# Patient Record
Sex: Female | Born: 1965 | Race: White | Hispanic: No | Marital: Married | Smoking: Never smoker
Health system: Southern US, Community
[De-identification: ages and names within clinical notes are randomized; demographics above are authoritative.]

## PROBLEM LIST (undated history)

## (undated) DIAGNOSIS — K219 Gastro-esophageal reflux disease without esophagitis: Secondary | ICD-10-CM

## (undated) DIAGNOSIS — R000 Tachycardia, unspecified: Secondary | ICD-10-CM

## (undated) DIAGNOSIS — H409 Unspecified glaucoma: Secondary | ICD-10-CM

## (undated) HISTORY — PX: VARICOSE VEIN SURGERY: SHX832

---

## 1999-06-16 ENCOUNTER — Other Ambulatory Visit: Admission: RE | Admit: 1999-06-16 | Discharge: 1999-06-16 | Payer: Self-pay | Admitting: Gynecology

## 1999-07-15 ENCOUNTER — Encounter: Admission: RE | Admit: 1999-07-15 | Discharge: 1999-10-13 | Payer: Self-pay | Admitting: Gynecology

## 1999-12-01 ENCOUNTER — Inpatient Hospital Stay (HOSPITAL_COMMUNITY): Admission: AD | Admit: 1999-12-01 | Discharge: 1999-12-01 | Payer: Self-pay | Admitting: Gynecology

## 1999-12-01 ENCOUNTER — Encounter: Payer: Self-pay | Admitting: Gynecology

## 1999-12-08 ENCOUNTER — Encounter: Payer: Self-pay | Admitting: Gynecology

## 1999-12-08 ENCOUNTER — Ambulatory Visit (HOSPITAL_COMMUNITY): Admission: RE | Admit: 1999-12-08 | Discharge: 1999-12-08 | Payer: Self-pay | Admitting: Gynecology

## 1999-12-10 ENCOUNTER — Inpatient Hospital Stay (HOSPITAL_COMMUNITY): Admission: AD | Admit: 1999-12-10 | Discharge: 1999-12-13 | Payer: Self-pay | Admitting: Gynecology

## 2002-05-08 ENCOUNTER — Encounter: Payer: Self-pay | Admitting: Internal Medicine

## 2002-05-08 ENCOUNTER — Ambulatory Visit (HOSPITAL_COMMUNITY): Admission: RE | Admit: 2002-05-08 | Discharge: 2002-05-08 | Payer: Self-pay | Admitting: Internal Medicine

## 2003-02-04 ENCOUNTER — Other Ambulatory Visit: Admission: RE | Admit: 2003-02-04 | Discharge: 2003-02-04 | Payer: Self-pay | Admitting: Gynecology

## 2003-02-04 ENCOUNTER — Ambulatory Visit (HOSPITAL_COMMUNITY): Admission: RE | Admit: 2003-02-04 | Discharge: 2003-02-04 | Payer: Self-pay | Admitting: Gynecology

## 2004-02-24 ENCOUNTER — Other Ambulatory Visit: Admission: RE | Admit: 2004-02-24 | Discharge: 2004-02-24 | Payer: Self-pay | Admitting: Gynecology

## 2004-03-06 ENCOUNTER — Encounter: Admission: RE | Admit: 2004-03-06 | Discharge: 2004-03-06 | Payer: Self-pay | Admitting: Gynecology

## 2005-03-25 ENCOUNTER — Other Ambulatory Visit: Admission: RE | Admit: 2005-03-25 | Discharge: 2005-03-25 | Payer: Self-pay | Admitting: Gynecology

## 2009-06-10 ENCOUNTER — Encounter: Payer: Self-pay | Admitting: Gynecology

## 2009-06-10 ENCOUNTER — Other Ambulatory Visit: Admission: RE | Admit: 2009-06-10 | Discharge: 2009-06-10 | Payer: Self-pay | Admitting: Gynecology

## 2009-06-10 ENCOUNTER — Ambulatory Visit: Payer: Self-pay | Admitting: Gynecology

## 2009-07-01 ENCOUNTER — Encounter: Admission: RE | Admit: 2009-07-01 | Discharge: 2009-07-01 | Payer: Self-pay | Admitting: Gynecology

## 2015-07-31 ENCOUNTER — Ambulatory Visit (HOSPITAL_COMMUNITY)
Admission: RE | Admit: 2015-07-31 | Discharge: 2015-07-31 | Disposition: A | Discharge status: Home / Self Care | Payer: BC Managed Care – PPO | Source: Ambulatory Visit | Attending: Physician Assistant | Admitting: Physician Assistant

## 2015-07-31 ENCOUNTER — Other Ambulatory Visit (HOSPITAL_COMMUNITY): Payer: Self-pay | Admitting: Physician Assistant

## 2015-07-31 DIAGNOSIS — R079 Chest pain, unspecified: Secondary | ICD-10-CM

## 2015-08-13 ENCOUNTER — Other Ambulatory Visit (HOSPITAL_COMMUNITY): Payer: Self-pay | Admitting: Physician Assistant

## 2015-08-13 DIAGNOSIS — Z1231 Encounter for screening mammogram for malignant neoplasm of breast: Secondary | ICD-10-CM

## 2015-08-20 ENCOUNTER — Ambulatory Visit (HOSPITAL_COMMUNITY): Payer: BC Managed Care – PPO

## 2015-09-01 ENCOUNTER — Ambulatory Visit (HOSPITAL_COMMUNITY)
Admission: RE | Admit: 2015-09-01 | Discharge: 2015-09-01 | Disposition: A | Discharge status: Home / Self Care | Payer: BC Managed Care – PPO | Source: Ambulatory Visit | Attending: Physician Assistant | Admitting: Physician Assistant

## 2015-09-01 DIAGNOSIS — Z1231 Encounter for screening mammogram for malignant neoplasm of breast: Secondary | ICD-10-CM | POA: Diagnosis not present

## 2016-08-24 ENCOUNTER — Emergency Department (HOSPITAL_COMMUNITY)
Admission: EM | Admit: 2016-08-24 | Discharge: 2016-08-24 | Disposition: A | Discharge status: Home / Self Care | Payer: No Typology Code available for payment source | Attending: Emergency Medicine | Admitting: Emergency Medicine

## 2016-08-24 ENCOUNTER — Emergency Department (HOSPITAL_COMMUNITY): Payer: No Typology Code available for payment source

## 2016-08-24 ENCOUNTER — Encounter (HOSPITAL_COMMUNITY): Payer: Self-pay | Admitting: Emergency Medicine

## 2016-08-24 DIAGNOSIS — Y999 Unspecified external cause status: Secondary | ICD-10-CM | POA: Insufficient documentation

## 2016-08-24 DIAGNOSIS — S0003XA Contusion of scalp, initial encounter: Secondary | ICD-10-CM

## 2016-08-24 DIAGNOSIS — Y9389 Activity, other specified: Secondary | ICD-10-CM | POA: Insufficient documentation

## 2016-08-24 DIAGNOSIS — S20219S Contusion of unspecified front wall of thorax, sequela: Secondary | ICD-10-CM

## 2016-08-24 DIAGNOSIS — Y92410 Unspecified street and highway as the place of occurrence of the external cause: Secondary | ICD-10-CM | POA: Insufficient documentation

## 2016-08-24 HISTORY — DX: Gastro-esophageal reflux disease without esophagitis: K21.9

## 2016-08-24 HISTORY — DX: Unspecified glaucoma: H40.9

## 2016-08-24 HISTORY — DX: Tachycardia, unspecified: R00.0

## 2016-08-24 LAB — TROPONIN I: Troponin I: <0.03 ng/mL (ref <0.03)

## 2016-08-24 MED ORDER — ONDANSETRON HCL 4 MG PO TABS
4.0000 mg | ORAL_TABLET | Freq: Once | ORAL | Status: AC
  Administered 2016-08-24: 4 mg via ORAL
  Filled 2016-08-24: qty 1

## 2016-08-24 MED ORDER — HYDROCODONE-ACETAMINOPHEN 5-325 MG PO TABS: 1.0000 | ORAL_TABLET | ORAL | 0 refills | Status: DC | PRN

## 2016-08-24 MED ORDER — HYDROCODONE-ACETAMINOPHEN 5-325 MG PO TABS
2.0000 | ORAL_TABLET | Freq: Once | ORAL | Status: AC
  Administered 2016-08-24: 2 via ORAL
  Filled 2016-08-24: qty 2

## 2016-08-24 MED ORDER — METHOCARBAMOL 500 MG PO TABS: 500.0000 mg | ORAL_TABLET | Freq: Three times a day (TID) | ORAL | 0 refills | Status: DC

## 2016-08-24 NOTE — ED Notes (Signed)
Troooper in with pt
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
## 2016-08-24 NOTE — ED Notes (Signed)
Pt returned from ct

## 2016-08-24 NOTE — ED Notes (Signed)
Pt taken to ct 
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
## 2016-08-24 NOTE — Discharge Instructions (Signed)
Your vital signs within normal limits. The CT scan of your head is negative for acute injury. The CT scan of your neck shows degenerative disc related problems and arthritis, but no fracture or dislocation. Your chest x-ray is negative for any lung or bone structure injury. Please use ibuprofen for mild soreness. Please use Robaxin for spasm pain, use Norco for severe pain. Robaxin and Norco may cause drowsiness or lightheadedness. Please do not drink alcohol, drive a vehicle, operate machinery, or participate in activities requiring concentration when taking this medication. Please return to the emergency department if any changes, problems, or concerns.

## 2017-08-12 ENCOUNTER — Other Ambulatory Visit (HOSPITAL_COMMUNITY): Payer: Self-pay | Admitting: Family Medicine

## 2017-08-12 DIAGNOSIS — Z1231 Encounter for screening mammogram for malignant neoplasm of breast: Secondary | ICD-10-CM

## 2017-08-29 ENCOUNTER — Ambulatory Visit (HOSPITAL_COMMUNITY)
Admission: RE | Admit: 2017-08-29 | Discharge: 2017-08-29 | Disposition: A | Discharge status: Home / Self Care | Payer: BC Managed Care – PPO | Source: Ambulatory Visit | Attending: Family Medicine | Admitting: Family Medicine

## 2017-08-29 DIAGNOSIS — Z1231 Encounter for screening mammogram for malignant neoplasm of breast: Secondary | ICD-10-CM | POA: Diagnosis not present

## 2018-10-25 ENCOUNTER — Encounter: Payer: Self-pay | Admitting: Cardiovascular Disease

## 2018-10-25 ENCOUNTER — Ambulatory Visit: Payer: BC Managed Care – PPO | Admitting: Cardiovascular Disease

## 2018-10-25 VITALS — BP 110/70 | HR 79 | Ht 63.0 in | Wt 265.0 lb

## 2018-10-25 DIAGNOSIS — Z7182 Exercise counseling: Secondary | ICD-10-CM | POA: Diagnosis not present

## 2018-10-25 DIAGNOSIS — I499 Cardiac arrhythmia, unspecified: Secondary | ICD-10-CM

## 2018-10-25 DIAGNOSIS — R002 Palpitations: Secondary | ICD-10-CM | POA: Diagnosis not present

## 2018-10-25 NOTE — Patient Instructions (Signed)
Medication Instructions: Your physician recommends that you continue on your current medications as directed. Please refer to the Current Medication list given to you today.   If you need a refill on your cardiac medications before your next appointment, please call your pharmacy.   Lab work:NONE  If you have labs (blood work) drawn today and your tests are completely normal, you will receive your results only by: Marland Kitchen MyChart Message (if you have MyChart) OR . A paper copy in the mail If you have any lab test that is abnormal or we need to change your treatment, we will call you to review the results.  Testing/Procedures:NONE  Follow-Up:As needed with Dr.Koneswaran  Any Other Special Instructions Will Be Listed Below (If Applicable). NONE

## 2018-10-25 NOTE — Progress Notes (Signed)
CARDIOLOGY CONSULT NOTE  Patient ID: AHMONI EDGE MRN: [id number] DOB/AGE: [birth year] 52 y.o.  Admit date: (Not on file) Primary Physician: Samuella Bruin Referring Physician: Samuella Bruin   Reason for Consultation: Arrhythmia  HPI: NEETA STOREY is a 52 y.o. female who is being seen today for the evaluation of arrhythmia at the request of Shawnie Dapper, PA-C.   She is currently being treated with Toprol-XL 100 mg daily.  I have no copies of ECGs from PCP.  ECG performed in the office today which I ordered and personally interpreted demonstrates normal sinus rhythm with incomplete right bundle branch block and late R wave transition.  She tells me that she has had palpitations dating back to 2003.  At that time she wore a monitor and had an echocardiogram.  These results are unavailable to me.  She very seldom has palpitations and they may occur once every few months and sometimes once a month.  They may last a few seconds and up to a minute.  They are not associated with chest pain nor shortness of breath.  She denies a history of orthopnea paroxysmal nocturnal dyspnea.  She denies a history of snoring and sleep apnea.  She previously underwent venous ablation in both legs.  She has had chronic right ankle swelling since that time.  She has GERD and takes Tums and omeprazole as needed.  She denies lightheadedness, dizziness, and syncope.  She has been on Toprol-XL since 2003.  She is obese.  She previously lost 58 pounds and put all of it back on.  She has several questions regarding weight loss.   Not on File  Current Outpatient Medications  Medication Sig Dispense Refill  . ALPHAGAN P 0.1 % SOLN Apply 1 drop to eye daily.    . Ascorbic Acid (VITAMIN C PO) Take 1 tablet by mouth daily.    . dorzolamide-timolol (COSOPT) 22.3-6.8 MG/ML ophthalmic solution 1 drop 2 (two) times daily.    Marland Kitchen glucosamine-chondroitin 500-400 MG tablet Take 1  tablet by mouth daily.    Marland Kitchen ibuprofen (ADVIL,MOTRIN) 200 MG tablet Take 400-600 mg by mouth every 6 (six) hours as needed for moderate pain.    Marland Kitchen latanoprost (XALATAN) 0.005 % ophthalmic solution Place 1 drop into both eyes at bedtime.    . metoprolol succinate (TOPROL-XL) 100 MG 24 hr tablet Take 1 tablet by mouth daily.    Marland Kitchen omeprazole (PRILOSEC) 40 MG capsule Take 1 capsule by mouth daily.    Marland Kitchen VITAMIN E PO Take 1 tablet by mouth daily.    Marland Kitchen HYDROcodone-acetaminophen (NORCO/VICODIN) 5-325 MG tablet Take 1 tablet by mouth every 4 (four) hours as needed. (Patient not taking: Reported on 10/25/2018) 15 tablet 0   No current facility-administered medications for this visit.     Past Medical History:  Diagnosis Date  . GERD (gastroesophageal reflux disease)   . Glaucoma   . Tachycardia     Past Surgical History:  Procedure Laterality Date  . VARICOSE VEIN SURGERY      Social History   Socioeconomic History  . Marital status: Married    Spouse name: Not on file  . Number of children: Not on file  . Years of education: Not on file  . Highest education level: Not on file  Occupational History  . Not on file  Social Needs  . Financial resource strain: Not on file  . Food insecurity:    Worry:  Not on file    Inability: Not on file  . Transportation needs:    Medical: Not on file    Non-medical: Not on file  Tobacco Use  . Smoking status: Never Smoker  . Smokeless tobacco: Never Used  Substance and Sexual Activity  . Alcohol use: No  . Drug use: No  . Sexual activity: Not on file  Lifestyle  . Physical activity:    Days per week: Not on file    Minutes per session: Not on file  . Stress: Not on file  Relationships  . Social connections:    Talks on phone: Not on file    Gets together: Not on file    Attends religious service: Not on file    Active member of club or organization: Not on file    Attends meetings of clubs or organizations: Not on file    Relationship  status: Not on file  . Intimate partner violence:    Fear of current or ex partner: Not on file    Emotionally abused: Not on file    Physically abused: Not on file    Forced sexual activity: Not on file  Other Topics Concern  . Not on file  Social History Narrative  . Not on file     No family history of premature CAD in 1st degree relatives.  Current Meds  Medication Sig  . ALPHAGAN P 0.1 % SOLN Apply 1 drop to eye daily.  . Ascorbic Acid (VITAMIN C PO) Take 1 tablet by mouth daily.  . dorzolamide-timolol (COSOPT) 22.3-6.8 MG/ML ophthalmic solution 1 drop 2 (two) times daily.  Marland Kitchen glucosamine-chondroitin 500-400 MG tablet Take 1 tablet by mouth daily.  Marland Kitchen ibuprofen (ADVIL,MOTRIN) 200 MG tablet Take 400-600 mg by mouth every 6 (six) hours as needed for moderate pain.  Marland Kitchen latanoprost (XALATAN) 0.005 % ophthalmic solution Place 1 drop into both eyes at bedtime.  . metoprolol succinate (TOPROL-XL) 100 MG 24 hr tablet Take 1 tablet by mouth daily.  Marland Kitchen omeprazole (PRILOSEC) 40 MG capsule Take 1 capsule by mouth daily.  Marland Kitchen VITAMIN E PO Take 1 tablet by mouth daily.      Review of systems complete and found to be negative unless listed above in HPI    Physical exam Blood pressure 110/70, pulse 79, height 5\' 3"  (1.6 m), weight 265 lb (120.2 kg), SpO2 98 %. General: NAD Neck: No JVD, no thyromegaly or thyroid nodule.  Lungs: Clear to auscultation bilaterally with normal respiratory effort. CV: Nondisplaced PMI. Regular rate and rhythm, normal S1/S2, no S3/S4, no murmur.  No peripheral edema.  No carotid bruit.    Abdomen: Soft, nontender, no distention, obese.  Skin: Intact without lesions or rashes.  Neurologic: Alert and oriented x 3.  Psych: Normal affect. Extremities: No clubbing or cyanosis.  HEENT: Normal.   ECG: Most recent ECG reviewed.   Labs: No results found for: K, BUN, CREATININE, ALT, TSH, HGB   Lipids: No results found for: LDLCALC, LDLDIRECT, CHOL, TRIG, HDL       ASSESSMENT AND PLAN:  1.  Arrhythmia/palpitations: Currently on Toprol-XL 100 mg daily.  Symptomatically stable.  I informed her that if palpitation frequency increased or they became associated with chest pain and/or shortness of breath, I would then consider cardiac monitoring as well as an echocardiogram.  At the present time, noninvasive studies are not indicated.  2.  Morbid obesity: I provided exercise counseling.  I encouraged her to begin a routine walking  regimen.  I educated her about the adverse effects of obesity over time and its association with chronic illnesses.     Disposition: Follow up as needed  Signed: Prentice Docker, M.D., F.A.C.C.  10/25/2018, 9:21 AM

## 2019-05-28 ENCOUNTER — Other Ambulatory Visit (HOSPITAL_COMMUNITY): Payer: Self-pay | Admitting: Physician Assistant

## 2019-05-28 DIAGNOSIS — Z1231 Encounter for screening mammogram for malignant neoplasm of breast: Secondary | ICD-10-CM

## 2019-06-04 ENCOUNTER — Ambulatory Visit (HOSPITAL_COMMUNITY)
Admission: RE | Admit: 2019-06-04 | Discharge: 2019-06-04 | Disposition: A | Discharge status: Home / Self Care | Payer: BC Managed Care – PPO | Source: Ambulatory Visit | Attending: Physician Assistant | Admitting: Physician Assistant

## 2019-06-04 ENCOUNTER — Other Ambulatory Visit: Payer: Self-pay

## 2019-06-04 DIAGNOSIS — Z1231 Encounter for screening mammogram for malignant neoplasm of breast: Secondary | ICD-10-CM | POA: Diagnosis not present

## 2019-07-04 ENCOUNTER — Other Ambulatory Visit: Payer: BC Managed Care – PPO | Admitting: Adult Health

## 2019-07-12 ENCOUNTER — Telehealth: Payer: Self-pay | Admitting: Obstetrics and Gynecology

## 2019-07-12 NOTE — Telephone Encounter (Signed)

## 2019-07-13 ENCOUNTER — Other Ambulatory Visit (HOSPITAL_COMMUNITY)
Admission: RE | Admit: 2019-07-13 | Discharge: 2019-07-13 | Disposition: A | Discharge status: Home / Self Care | Payer: BC Managed Care – PPO | Source: Ambulatory Visit | Attending: Obstetrics and Gynecology | Admitting: Obstetrics and Gynecology

## 2019-07-13 ENCOUNTER — Ambulatory Visit (INDEPENDENT_AMBULATORY_CARE_PROVIDER_SITE_OTHER): Payer: BC Managed Care – PPO | Admitting: Obstetrics and Gynecology

## 2019-07-13 ENCOUNTER — Other Ambulatory Visit: Payer: Self-pay

## 2019-07-13 ENCOUNTER — Encounter: Payer: Self-pay | Admitting: Obstetrics and Gynecology

## 2019-07-13 VITALS — BP 129/75 | HR 72 | Ht 63.0 in | Wt 269.4 lb

## 2019-07-13 DIAGNOSIS — Z01419 Encounter for gynecological examination (general) (routine) without abnormal findings: Secondary | ICD-10-CM | POA: Insufficient documentation

## 2019-07-13 NOTE — Progress Notes (Signed)
Patient ID: Dorothy West, female   DOB: 1966/06/10, 53 y.o.   MRN: [id number]  Assessment:  Annual Gyn Exam Perimenopause Plan:  1. pap smear done, next pap due 3 years-5 yr 2. return annually or prn 3    Annual mammogram advised after age 53 Pt given cologuard information sheet Subjective:  Dorothy West is a 53 y.o. female No obstetric history on file. who presents for annual exam. No LMP recorded. Patient is perimenopausal. The patient has complaints today of believes she has some rectal hemorrhoids. Hasn't had period since March 11 2019.No problem bowel or urination  The following portions of the patient's history were reviewed and updated as appropriate: allergies, current medications, past family history, past medical history, past social history, past surgical history and problem list. Past Medical History:  Diagnosis Date  . GERD (gastroesophageal reflux disease)   . Glaucoma   . Tachycardia     Past Surgical History:  Procedure Laterality Date  . VARICOSE VEIN SURGERY       Current Outpatient Medications:  .  ALPHAGAN P 0.1 % SOLN, Apply 1 drop to eye daily., Disp: , Rfl:  .  Ascorbic Acid (VITAMIN C PO), Take 1 tablet by mouth daily., Disp: , Rfl:  .  dorzolamide-timolol (COSOPT) 22.3-6.8 MG/ML ophthalmic solution, 1 drop 2 (two) times daily., Disp: , Rfl:  .  glucosamine-chondroitin 500-400 MG tablet, Take 1 tablet by mouth daily., Disp: , Rfl:  .  HYDROcodone-acetaminophen (NORCO/VICODIN) 5-325 MG tablet, Take 1 tablet by mouth every 4 (four) hours as needed. (Patient not taking: Reported on 10/25/2018), Disp: 15 tablet, Rfl: 0 .  ibuprofen (ADVIL,MOTRIN) 200 MG tablet, Take 400-600 mg by mouth every 6 (six) hours as needed for moderate pain., Disp: , Rfl:  .  latanoprost (XALATAN) 0.005 % ophthalmic solution, Place 1 drop into both eyes at bedtime., Disp: , Rfl:  .  metoprolol succinate (TOPROL-XL) 100 MG 24 hr tablet, Take 1 tablet by mouth daily., Disp: , Rfl:  .   omeprazole (PRILOSEC) 40 MG capsule, Take 1 capsule by mouth daily., Disp: , Rfl:  .  VITAMIN E PO, Take 1 tablet by mouth daily., Disp: , Rfl:   Review of Systems Constitutional: negative Gastrointestinal: negative Genitourinary: normal  Objective:  There were no vitals taken for this visit.   BMI: There is no height or weight on file to calculate BMI.  General Appearance: Alert, appropriate appearance for age. No acute distress HEENT: Grossly normal Neck / Thyroid:  Cardiovascular: RRR; normal S1, S2, no murmur Lungs: CTA bilaterally Back: No CVAT Breast Exam: No masses or nodes.No dimpling, nipple retraction or discharge.. Mammo done 06/05/2019 normal Gastrointestinal: Soft, non-tender, no masses or organomegaly Pelvic Exam: VAGINA: normal appearing vagina  CERVIX: normal appearing cervix  UTERUS: uterus is normal size, shape, consistency and nontender,  RECTAL: guaiac negative stool obtained, loose skin tags in rectal area, denies any sisues with bowel movements. PAP: Pap smear done today. Lymphatic Exam: Non-palpable nodes in neck, clavicular, axillary, or inguinal regions  Skin: no rash or abnormalities Neurologic: Normal gait and speech, no tremor  Psychiatric: Alert and oriented, appropriate affect.  Urinalysis:Not done  By signing my name below, I, Arnette NorrisMari Johnson, attest that this documentation has been prepared under the direction and in the presence of Tilda BurrowFerguson, Twylla Arceneaux V, MD. Electronically Signed: Arnette NorrisMari Johnson Medical Scribe. 07/13/19. 12:19 PM.  I personally performed the services described in this documentation, which was SCRIBED in my presence. The recorded information has  been reviewed and considered accurate. It has been edited as necessary during review. Jonnie Kind, MD

## 2019-07-17 LAB — CYTOLOGY - PAP
Diagnosis: NEGATIVE
HPV: NOT DETECTED

## 2019-11-07 ENCOUNTER — Telehealth: Payer: Self-pay | Admitting: *Deleted

## 2019-11-07 ENCOUNTER — Other Ambulatory Visit: Payer: Self-pay

## 2019-11-07 ENCOUNTER — Ambulatory Visit: Payer: BC Managed Care – PPO | Admitting: Podiatry

## 2019-11-07 ENCOUNTER — Ambulatory Visit (INDEPENDENT_AMBULATORY_CARE_PROVIDER_SITE_OTHER): Payer: BC Managed Care – PPO

## 2019-11-07 ENCOUNTER — Other Ambulatory Visit: Payer: Self-pay | Admitting: Podiatry

## 2019-11-07 DIAGNOSIS — M7661 Achilles tendinitis, right leg: Secondary | ICD-10-CM

## 2019-11-07 DIAGNOSIS — M79671 Pain in right foot: Secondary | ICD-10-CM

## 2019-11-07 MED ORDER — MELOXICAM 15 MG PO TABS: 15.0000 mg | ORAL_TABLET | Freq: Every day | ORAL | 1 refills | Status: AC

## 2019-11-07 NOTE — Telephone Encounter (Signed)
Faxed referral orders, demographics to [facility].

## 2019-11-07 NOTE — Telephone Encounter (Signed)
-----   Message from Edrick Kins, DPM sent at 11/07/2019 10:37 AM EST ----- Regarding: Refer to PT in [place] Please refer patient to physical therapy somewhere in [place]?  2x/week x 6 weeks.   Dx: achilles tendinitis right.   Thanks, Dr. Amalia Hailey

## 2019-11-17 NOTE — Progress Notes (Signed)
   HPI: 53 y.o. female presenting today as a new patient, referred by Dr. Gershon Mussel, with a chief complaint of pain to the right plantar midfoot and heel that began 3-4 months ago. Dr. Gershon Mussel has prescribed Meloxicam 7.5 mg and a CAM boot for treatment. She has been taking Advil as well which helps alleviate the symptoms. Walking and being on the foot increases the pain. Patient is here for further evaluation and treatment.    Past Medical History:  Diagnosis Date  . GERD (gastroesophageal reflux disease)   . Glaucoma   . Tachycardia       Physical Exam: General: The patient is alert and oriented x3 in no acute distress.  Dermatology: Skin is warm, dry and supple bilateral lower extremities. Negative for open lesions or macerations.  Vascular: Palpable pedal pulses bilaterally. No edema or erythema noted. Capillary refill within normal limits.  Neurological: Epicritic and protective threshold grossly intact bilaterally.   Musculoskeletal Exam: Pain on palpation noted to the palpable mass along achilles approximately 6 cm above the insertion of the right lower extremity. Range of motion within normal limits. Muscle strength 5/5 in all muscle groups bilateral lower extremities.  Radiographic Exam:  Posterior calcaneal spur noted to the respective calcaneus on lateral view. No fracture or dislocation noted. Normal osseous mineralization noted.     Assessment: 1. Achilles tendinitis / tendinosis right  2. Retrocalcaneal bursitis   Plan of Care:  1. Patient was evaluated. Radiographs were reviewed today. 2. Transition out of CAM boot.  3. Physical therapy in  twice weekly.  4. Prescription for Meloxicam provided to patient. 5. Return to clinic in 4 weeks.   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    Edrick Kins, DPM [facility]  Dr. Edrick Kins, [address]                                        [place],  [place] [address]                Office [phone]  Fax [phone]

## 2019-11-22 ENCOUNTER — Encounter (HOSPITAL_COMMUNITY): Payer: Self-pay | Admitting: Physical Therapy

## 2019-11-22 ENCOUNTER — Other Ambulatory Visit: Payer: Self-pay

## 2019-11-22 ENCOUNTER — Telehealth (HOSPITAL_COMMUNITY): Payer: Self-pay | Admitting: Physical Therapy

## 2019-11-22 ENCOUNTER — Ambulatory Visit (HOSPITAL_COMMUNITY): Payer: BC Managed Care – PPO | Attending: Podiatry | Admitting: Physical Therapy

## 2019-11-22 DIAGNOSIS — M79662 Pain in left lower leg: Secondary | ICD-10-CM

## 2019-11-22 NOTE — Patient Instructions (Addendum)
Gastroc Stretch    Stand with right foot back, leg straight, forward leg bent. Keeping heel on floor, turned slightly out, lean into wall until stretch is felt in calf. Hold _30___ seconds. Repeat __3__ times per set. Do __1__ sets per session. Do _2___ sessions per day.  http://orth.exer.us/26   Copyright  VHI. All rights reserved.  Plantar Fascia Stretch    Standing with only ball of left foot on stair, push heel down until stretch is felt through arch of foot. Hold __30__ seconds. Relax. Repeat _3___ times per set. Do _1___ sets per session. Do _2___ sessions per day.  http://orth.exer.us/22   Copyright  VHI. All rights reserved.

## 2019-11-22 NOTE — Therapy (Signed)
[facility] [address] [place], [place], [address] Phone: [phone]   Fax:  [phone]  Physical Therapy Evaluation  Patient Details  Name: Heloise Beechameresa P Byas MRN: [id number] Date of Birth: [birth year] Referring Provider (PT): Gala LewandowskyBrent Evans    Encounter Date: 11/22/2019  PT End of Session - 11/22/19 1424    Visit Number  1    Number of Visits  1    Authorization Type  BCBS    PT Start Time  1320    PT Stop Time  1400    PT Time Calculation (min)  40 min    Activity Tolerance  Patient tolerated treatment well    Behavior During Therapy  [facility] for tasks assessed/performed       Past Medical History:  Diagnosis Date  . GERD (gastroesophageal reflux disease)   . Glaucoma   . Tachycardia     Past Surgical History:  Procedure Laterality Date  . VARICOSE VEIN SURGERY      There were no vitals filed for this visit.   Subjective Assessment - 11/22/19 1315    Subjective  Ms. Alona BeneJoyce states that she began having pain in her RT mid foot and tendon about four months ago.  She has been treated by Meloxicam and being placed in a Camboot for several weeks.  She wore the boot until early November.  She states that she is much better but she still occasionally has pain with it therefore she was referred to skilled therapy.    Pertinent History  unremarkable    Limitations  House hold activities;Walking;Standing    How long can you sit comfortably?  no problem    How long can you stand comfortably?  not sure    How long can you walk comfortably?  She has not done much walking since she came out of the Camboot.    Patient Stated Goals  She is not sure states that she is walking better than she has in several months.    Currently in Pain?  Yes    Pain Score  2    worst in the past week shooting pains go quickly 6/10   Pain Location  Ankle    Pain Orientation  Right    Pain Descriptors / Indicators  Tightness    Pain Type  Chronic pain    Pain  Onset  More than a month ago    Pain Frequency  Intermittent    Aggravating Factors   walking    Pain Relieving Factors  rest    Effect of Pain on Daily Activities  no change.         [facility] - 11/22/19 0001      Assessment   Medical Diagnosis  RT achilles tendonitis     Referring Provider (PT)  Gala LewandowskyBrent Evans     Onset Date/Surgical Date  07/21/19    Prior Therapy  none       Precautions   Precautions  None      Restrictions   Weight Bearing Restrictions  No      Balance Screen   Has the patient fallen in the past 6 months  No    Has the patient had a decrease in activity level because of a fear of falling?   Yes    Is the patient reluctant to leave their home because of a fear of falling?   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     Focus on Therapeutic Outcomes (FOTO)   69      Functional Tests   Functional tests  Single leg stance      Single Leg Stance   Comments  LT:  42",RT 9"      ROM / Strength   AROM / PROM / Strength  Strength;AROM      AROM   AROM Assessment Site  Ankle   ROM WFL    Right/Left Ankle  Right      Strength   Strength Assessment Site  Ankle    Right/Left Ankle  Right    Right Ankle Dorsiflexion  5/5    Right Ankle Plantar Flexion  4+/5    Right Ankle Inversion  5/5    Right Ankle Eversion  5/5                Objective measurements completed on examination: See above findings.      [facility] Adult PT Treatment/Exercise - 11/22/19 0001      Exercises   Exercises  Ankle      Gastroc and plantar  fascia stretch 30" x 3        PT Education - 11/22/19 1421    Education Details  The importance of wearing compression stockings.  PT measured and given sheet of where she can acquire compression wear, Self friction massage, and HEP    Person(s) Educated  Patient    Methods  Explanation;Handout;Verbal cues;Demonstration;Tactile cues    Comprehension  Verbalized understanding;Returned demonstration       PT Short Term Goals - 11/22/19 1430      PT SHORT TERM GOAL #1   Title  PT to be I in HEP to decrease pain in Rt achilles to no greater than a 1/10.    Time  3    Period  Weeks    Status  New      PT SHORT TERM GOAL #2   Title  PT to be I in self friction massage to improve healing of gastroc tendon    Time  3    Period  Weeks                Plan - 11/22/19 1425    Clinical Impression Statement  Ms. Tamas is a 53 yo female who had progressive pain with walking.  She was placed in a camboot and givent antiinflamatory medication and now states that she is walking better than she has for months.  She will still occasionally experience quick shooting pain and has noticed swelling therefore she was referred to skilled out patient PT.  Examination demonstrates decreased balance, increased swelling.  ROM and strength are wnl.  Therapist urged pt to wear compression, educated in HEP and self massage.  I feel the pt should do well with this.  I recommended she follows the advice given to her and if she is not doing better in 2-3 weeks she can  call the clinic and we will begin treatment.    Personal Factors and Comorbidities  Comorbidity 1    Comorbidities  CVI,    Examination-Activity Limitations  --   not limiting pt at this time as she is doing much better, pt has rtw.   Stability/Clinical Decision Making  Stable/Uncomplicated    Clinical Decision Making  Low    Rehab Potential  Excellent    PT Frequency  1x / week    PT Duration  --   1 week   PT Treatment/Interventions   Patient/family education       Patient will benefit from skilled therapeutic intervention in order to improve the following deficits and impairments:     Visit Diagnosis: Pain in left lower leg     Problem List There are no active problems to display for this patient.   Rayetta Humphrey, PT CLT [phone] 11/22/2019, 2:35 PM  [facility] [address] [place], [place], [address] Phone: [phone]   Fax:  [phone]  Name: JACKELYN ILLINGWORTH MRN: [id number] Date of Birth: Jul 16, 1966

## 2019-11-24 NOTE — Telephone Encounter (Signed)
Enter in error. Rayetta Humphrey, [place] CLT [phone]

## 2019-12-10 ENCOUNTER — Other Ambulatory Visit: Payer: Self-pay

## 2019-12-10 ENCOUNTER — Ambulatory Visit: Payer: BC Managed Care – PPO | Admitting: Podiatry

## 2019-12-10 DIAGNOSIS — M7661 Achilles tendinitis, right leg: Secondary | ICD-10-CM | POA: Diagnosis not present

## 2019-12-11 ENCOUNTER — Telehealth: Payer: Self-pay | Admitting: *Deleted

## 2019-12-11 DIAGNOSIS — T148XXA Other injury of unspecified body region, initial encounter: Secondary | ICD-10-CM

## 2019-12-11 DIAGNOSIS — M7661 Achilles tendinitis, right leg: Secondary | ICD-10-CM

## 2019-12-11 NOTE — Progress Notes (Signed)
   HPI: 53 y.o. female presenting today for follow-up treatment evaluation regarding Achilles tendinitis to the right lower extremity.  She has been treated approximately 6 months now conservatively without any satisfactory alleviation of symptoms.  She went to physical therapy and was instructed to do exercises at home.  She does not see any change or any improvement in her pain.  She presents for further treatment and evaluation   Past Medical History:  Diagnosis Date  . GERD (gastroesophageal reflux disease)   . Glaucoma   . Tachycardia       Physical Exam: General: The patient is alert and oriented x3 in no acute distress.  Dermatology: Skin is warm, dry and supple bilateral lower extremities. Negative for open lesions or macerations.  Vascular: Palpable pedal pulses bilaterally. No edema or erythema noted. Capillary refill within normal limits.  Neurological: Epicritic and protective threshold grossly intact bilaterally.   Musculoskeletal Exam: Pain on palpation noted to the palpable mass along achilles approximately 6 cm above the insertion of the right lower extremity. Range of motion within normal limits. Muscle strength 5/5 in all muscle groups bilateral lower extremities.  Radiographic Exam:  Posterior calcaneal spur noted to the respective calcaneus on lateral view. No fracture or dislocation noted. Normal osseous mineralization noted.     Assessment: 1. Achilles tendinitis / tendinosis right  2. Retrocalcaneal bursitis   Plan of Care:  1. Patient was evaluated.  2.  Today were going to order an MRI right Achilles tendon to rule out rupture/tear.  The patient been treated conservatively for approximately 6 months now without any improvement of symptoms. 3.  Continue meloxicam daily 4.  Continue home physical therapy exercises 5.  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    Edrick Kins, DPM [facility]  Dr. Edrick Kins, [place]                                        [place], [place] [address]                Office [phone]  Fax [phone]

## 2019-12-11 NOTE — Telephone Encounter (Signed)
Orders to G. Martin, CMA for pre-cert, faxed to [facility]. 

## 2019-12-11 NOTE — Telephone Encounter (Signed)
-----   Message from Edrick Kins, DPM sent at 12/11/2019  1:26 PM EST ----- Regarding: MRI RT achilles Please order MRI right Achilles without contrast  Dx: Partial tear/tendinosis right Achilles  Thanks, Dr. Amalia Hailey

## 2019-12-24 ENCOUNTER — Other Ambulatory Visit: Payer: Self-pay

## 2019-12-24 ENCOUNTER — Ambulatory Visit
Admission: RE | Admit: 2019-12-24 | Discharge: 2019-12-24 | Disposition: A | Discharge status: Home / Self Care | Payer: BC Managed Care – PPO | Source: Ambulatory Visit | Attending: Podiatry | Admitting: Podiatry

## 2019-12-24 DIAGNOSIS — T148XXA Other injury of unspecified body region, initial encounter: Secondary | ICD-10-CM

## 2019-12-24 DIAGNOSIS — M7661 Achilles tendinitis, right leg: Secondary | ICD-10-CM

## 2020-01-07 ENCOUNTER — Ambulatory Visit: Payer: BC Managed Care – PPO | Admitting: Podiatry

## 2020-01-16 ENCOUNTER — Other Ambulatory Visit: Payer: Self-pay

## 2020-01-16 ENCOUNTER — Ambulatory Visit: Payer: BC Managed Care – PPO | Admitting: Podiatry

## 2020-01-16 DIAGNOSIS — M7661 Achilles tendinitis, right leg: Secondary | ICD-10-CM

## 2020-01-19 NOTE — Progress Notes (Signed)
   HPI: 54 y.o. female presenting today for follow-up treatment evaluation regarding Achilles tendinitis to the right lower extremity. She states she is doing well and improving. She has been doing home physical therapy and wearing compression socks which have helped alleviate her symptoms. She reports the swelling has now resolved. She denies any pain or worsening factors. She had an MRI of the right ankle on 12/24/2019. Patient is here for further evaluation and treatment.   Past Medical History:  Diagnosis Date  . GERD (gastroesophageal reflux disease)   . Glaucoma   . Tachycardia       Physical Exam: General: The patient is alert and oriented x3 in no acute distress.  Dermatology: Skin is warm, dry and supple bilateral lower extremities. Negative for open lesions or macerations.  Vascular: Palpable pedal pulses bilaterally. No edema or erythema noted. Capillary refill within normal limits.  Neurological: Epicritic and protective threshold grossly intact bilaterally.   Musculoskeletal Exam: Pain on palpation noted to the palpable mass along achilles approximately 6 cm above the insertion of the right lower extremity. Range of motion within normal limits. Muscle strength 5/5 in all muscle groups bilateral lower extremities.  MRI Impression:  Mild to moderate Achilles tendinopathy without tear. The examination is otherwise negative.    Assessment: 1. Achilles tendinitis / tendinosis right - improved  2. Retrocalcaneal bursitis   Plan of Care:  1. Patient was evaluated. MRI reviewed.  2. Continue using below knee compression socks.  3. Continue taking Meloxicam as needed.  4. Return to clinic as needed.   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    Felecia Shelling, DPM [facility]  Dr. Felecia Shelling, DPM    [address]                                        [place], [place] [address]                Office [phone]  Fax  [phone]

## 2022-02-10 ENCOUNTER — Other Ambulatory Visit (HOSPITAL_COMMUNITY): Payer: Self-pay | Admitting: Internal Medicine

## 2022-02-10 DIAGNOSIS — Z1231 Encounter for screening mammogram for malignant neoplasm of breast: Secondary | ICD-10-CM

## 2022-03-22 ENCOUNTER — Ambulatory Visit (HOSPITAL_COMMUNITY)
Admission: RE | Admit: 2022-03-22 | Discharge: 2022-03-22 | Disposition: A | Discharge status: Home / Self Care | Payer: BC Managed Care – PPO | Source: Ambulatory Visit | Attending: Internal Medicine | Admitting: Internal Medicine

## 2022-03-22 DIAGNOSIS — Z1231 Encounter for screening mammogram for malignant neoplasm of breast: Secondary | ICD-10-CM | POA: Diagnosis present

## 2023-01-15 IMAGING — MG MM DIGITAL SCREENING BILAT W/ TOMO AND CAD
8 of 14 series · 8 of 40 positions shown · non-contrast
Comparison: Previous exam(s).

CLINICAL DATA: Screening.

EXAM:
DIGITAL SCREENING BILATERAL MAMMOGRAM WITH TOMOSYNTHESIS AND CAD
TECHNIQUE: Bilateral screening digital craniocaudal and mediolateral oblique
mammograms were obtained. Bilateral screening digital breast
tomosynthesis was performed. The images were evaluated with
computer-aided detection.

[L MLO synth-2D (1 of 2)]
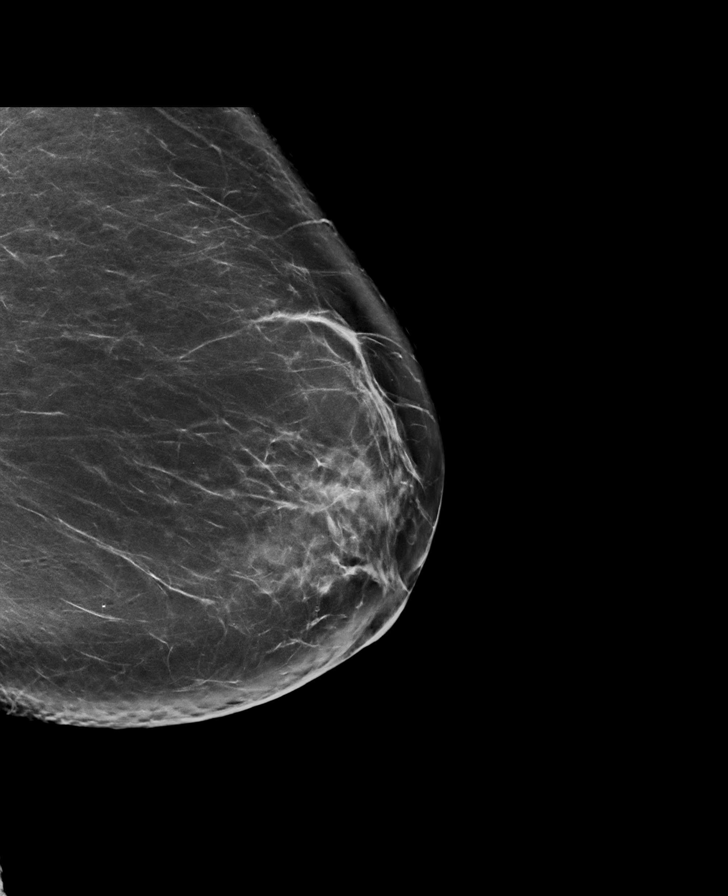

[R CC synth-2D (1 of 2)]
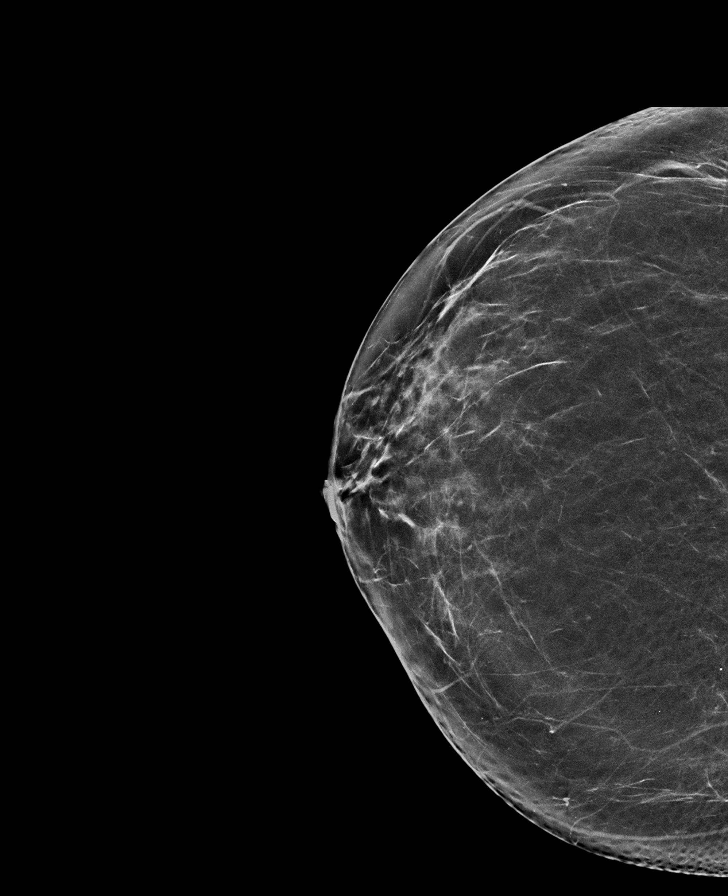

[L CC synth-2D (1 of 2)]
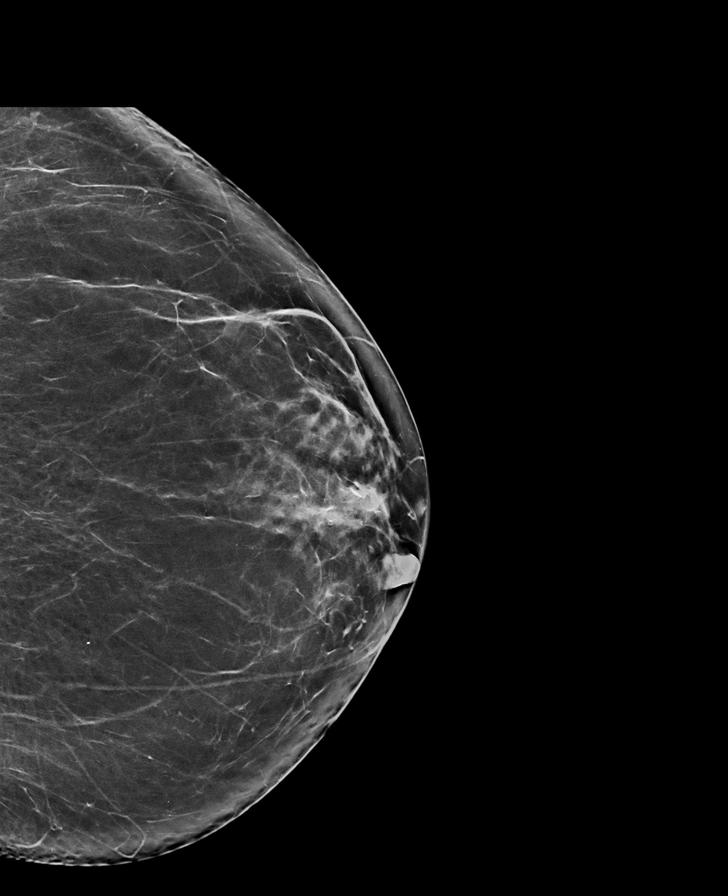

[R MLO synth-2D]
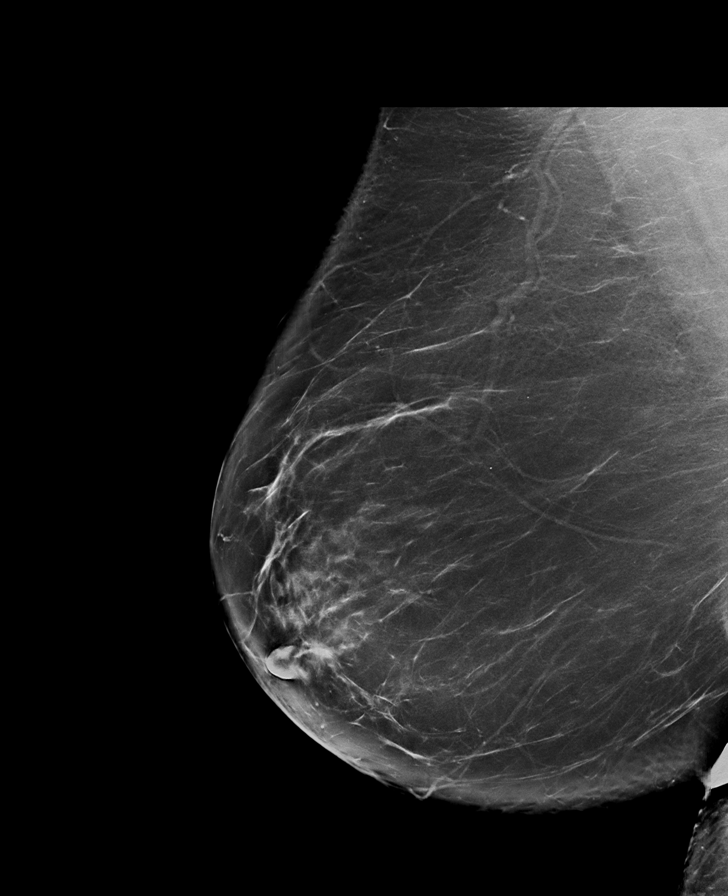

[L MLO synth-2D (2 of 2)]
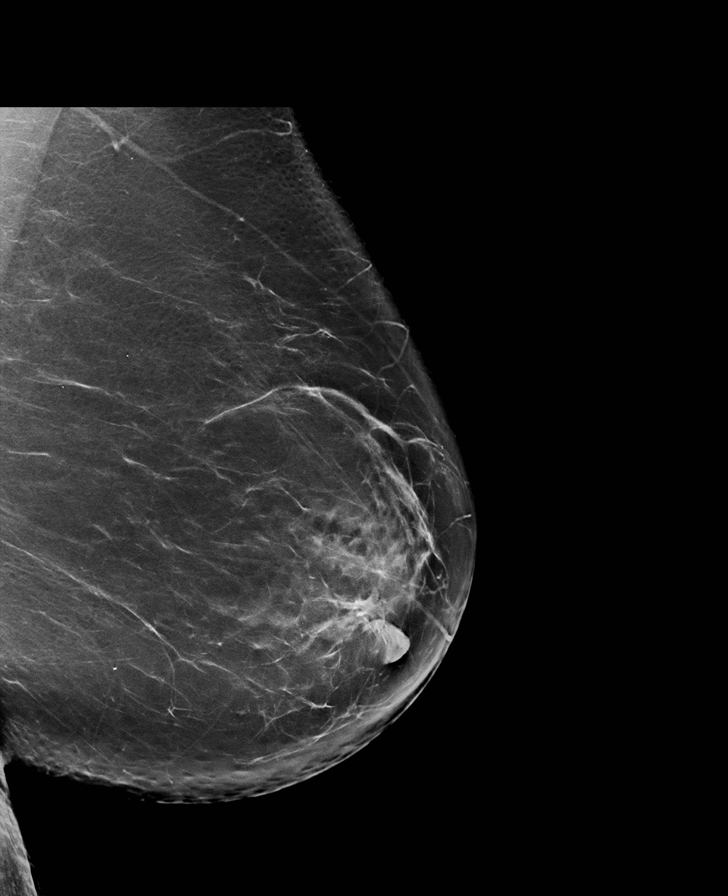

[R CC synth-2D (2 of 2)]
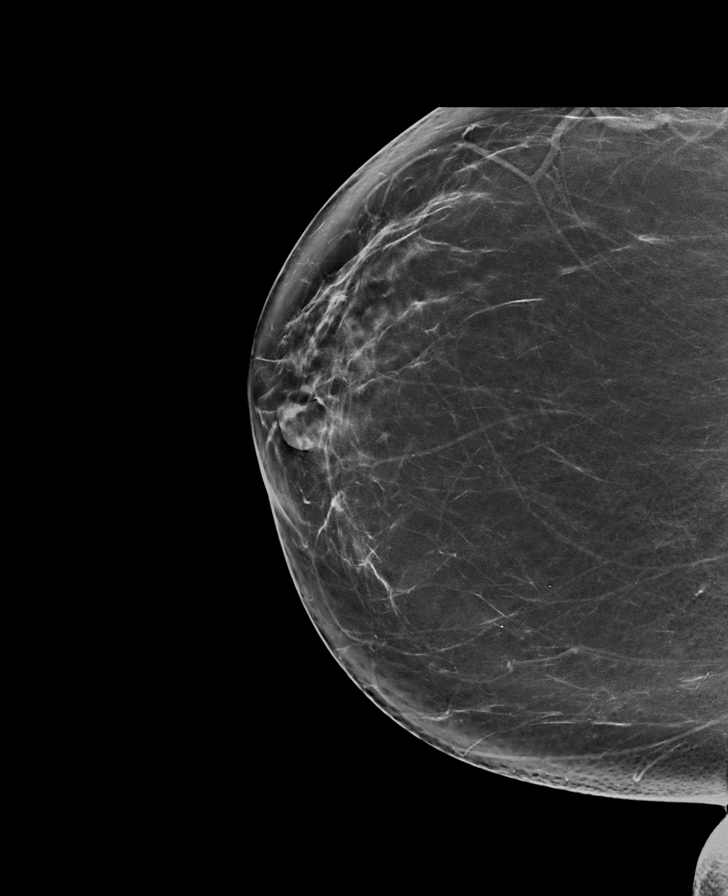

[L CC synth-2D (2 of 2)]
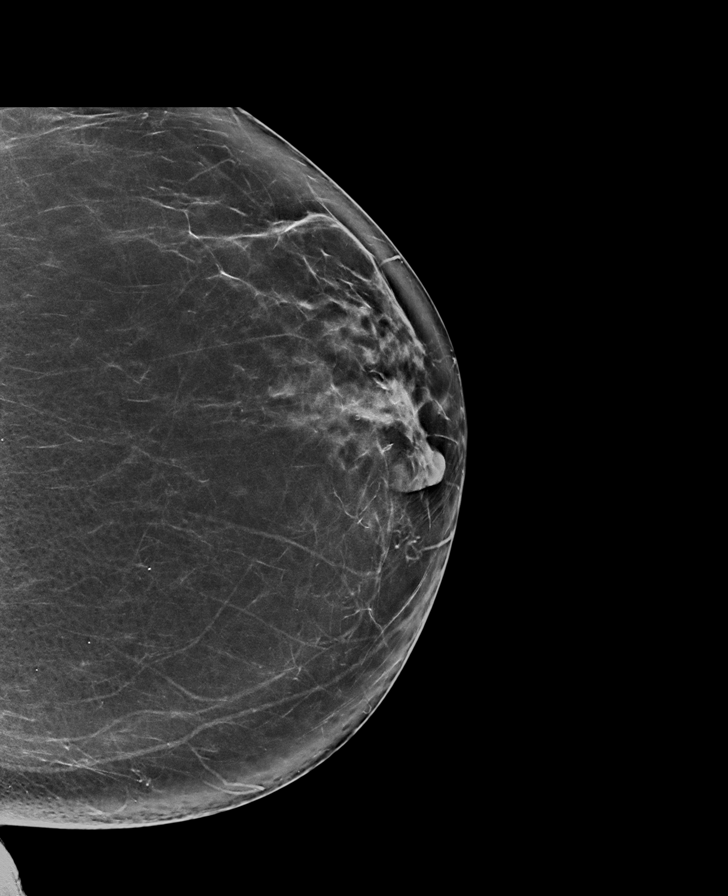

[L CC tomo · tomo slice 43/86.0]
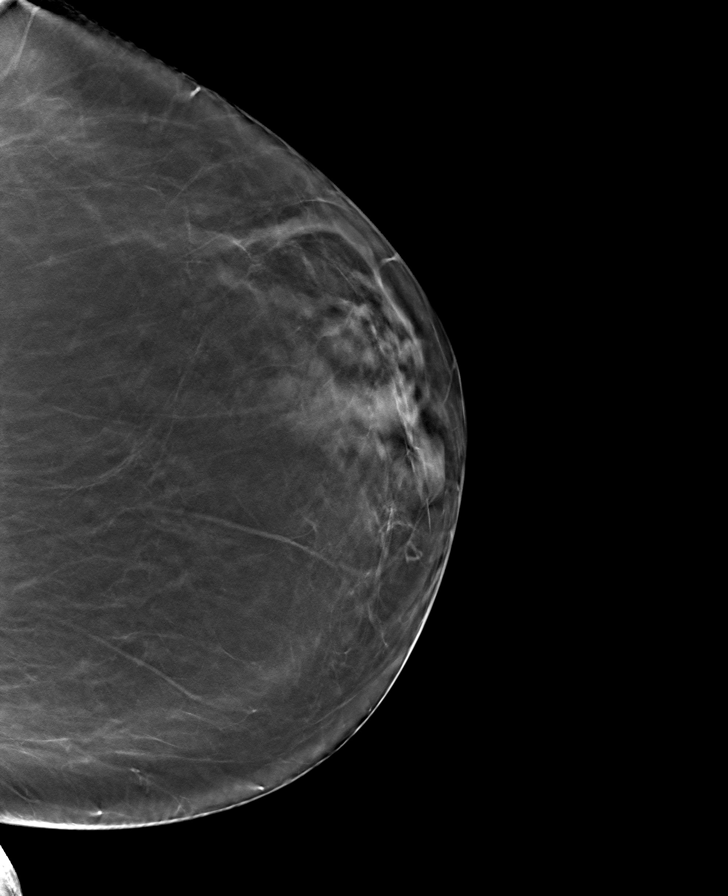

[8 of 40 positions shown; findings below may reference images not displayed]

ACR Breast Density Category b: There are scattered areas of
fibroglandular density.
FINDINGS: There are no findings suspicious for malignancy.
IMPRESSION: No mammographic evidence of malignancy. A result letter of this
screening mammogram will be mailed directly to the patient.

RECOMMENDATION:
Screening mammogram in one year. (Code:51-O-LD2)

BI-RADS CATEGORY  1: Negative.

## 2023-08-20 ENCOUNTER — Ambulatory Visit
Admission: EM | Admit: 2023-08-20 | Discharge: 2023-08-20 | Disposition: A | Discharge status: Home / Self Care | Payer: BC Managed Care – PPO | Source: Home / Self Care

## 2023-08-20 DIAGNOSIS — U071 COVID-19: Secondary | ICD-10-CM

## 2023-08-20 MED ORDER — PAXLOVID (300/100) 20 X 150 MG & 10 X 100MG PO TBPK: 3.0000 | ORAL_TABLET | Freq: Two times a day (BID) | ORAL | 0 refills | Status: AC

## 2023-08-20 MED ORDER — PROMETHAZINE-DM 6.25-15 MG/5ML PO SYRP: 5.0000 mL | ORAL_SOLUTION | Freq: Four times a day (QID) | ORAL | 0 refills | Status: AC | PRN

## 2023-08-20 MED ORDER — FLUTICASONE PROPIONATE 50 MCG/ACT NA SUSP: 2.0000 | Freq: Every day | NASAL | 0 refills | Status: AC

## 2023-08-20 NOTE — ED Provider Notes (Signed)
[facility]    CSN: [id number] Arrival date & time: 08/20/23  4098      History   Chief Complaint Chief Complaint  Patient presents with   Cough    HPI Dorothy West is a 57 y.o. female.   The history is provided by the patient.   Patient presents for complaints of chills, body aches, headache, nasal congestion, runny nose, and cough that been present for the past 3 days.  Patient states that she took a home COVID test which was positive.  Patient denies fever, ear pain, ear drainage, wheezing, difficulty breathing, chest pain, abdominal pain, nausea, vomiting, or diarrhea.  Patient reports she has been taking over-the-counter cough and cold medications for her symptoms.  Past Medical History:  Diagnosis Date   GERD (gastroesophageal reflux disease)    Glaucoma    Tachycardia     There are no problems to display for this patient.   Past Surgical History:  Procedure Laterality Date   VARICOSE VEIN SURGERY      OB History   No obstetric history on file.      Home Medications    Prior to Admission medications   Medication Sig Start Date End Date Taking? Authorizing Provider  fluticasone (FLONASE) 50 MCG/ACT nasal spray Place 2 sprays into both nostrils daily. 08/20/23  Yes Dekayla Prestridge-Warren, Sadie Haber, NP  nirmatrelvir & ritonavir (PAXLOVID, 300/100,) 20 x 150 MG & 10 x 100MG  TBPK Take 3 tablets by mouth 2 (two) times daily for 5 days. 08/20/23 08/25/23 Yes Marsalis Beaulieu-Warren, Sadie Haber, NP  promethazine-dextromethorphan (PROMETHAZINE-DM) 6.25-15 MG/5ML syrup Take 5 mLs by mouth 4 (four) times daily as needed. 08/20/23  Yes Josselyn Harkins-Warren, Sadie Haber, NP  ALPHAGAN P 0.1 % SOLN Apply 1 drop to eye daily. 08/18/16   [provider]  Ascorbic Acid (VITAMIN C PO) Take 1 tablet by mouth daily.    [provider]  dorzolamide-timolol (COSOPT) 22.3-6.8 MG/ML ophthalmic solution 1 drop 2 (two) times daily. 06/17/16   [provider]   glucosamine-chondroitin 500-400 MG tablet Take 1 tablet by mouth daily.    [provider]  ibuprofen (ADVIL,MOTRIN) 200 MG tablet Take 400-600 mg by mouth every 6 (six) hours as needed for moderate pain.    [provider]  latanoprost (XALATAN) 0.005 % ophthalmic solution Place 1 drop into both eyes at bedtime. 08/16/16   [provider]  meloxicam (MOBIC) 15 MG tablet Take 1 tablet (15 mg total) by mouth daily. 11/07/19   Felecia Shelling, DPM  metoprolol succinate (TOPROL-XL) 100 MG 24 hr tablet Take 1 tablet by mouth daily. 06/17/16   [provider]  omeprazole (PRILOSEC) 40 MG capsule Take 1 capsule by mouth daily. 06/17/16   [provider]  VITAMIN E PO Take 1 tablet by mouth daily.    [provider]    Family History Family History  Problem Relation Age of Onset   Heart attack Father    Colon polyps Sister    Diabetes Brother     Social History Social History   Tobacco Use   Smoking status: Never   Smokeless tobacco: Never  Vaping Use   Vaping status: Never Used  Substance Use Topics   Alcohol use: No   Drug use: No     Allergies   Patient has no known allergies.   Review of Systems Review of Systems Per HPI  Physical Exam Triage Vital Signs ED Triage Vitals  Encounter Vitals Group  BP 08/20/23 1117 138/83     Systolic BP Percentile --      Diastolic BP Percentile --      Pulse Rate 08/20/23 1117 82     Resp 08/20/23 1117 16     Temp 08/20/23 1117 98.4 F (36.9 C)     Temp Source 08/20/23 1117 Oral     SpO2 08/20/23 1117 95 %     Weight --      Height --      Head Circumference --      Peak Flow --      Pain Score 08/20/23 1119 3     Pain Loc --      Pain Education --      Exclude from Growth Chart --    No data found.  Updated Vital Signs BP 138/83 (BP Location: Right Arm)   Pulse 82   Temp 98.4 F (36.9 C) (Oral)   Resp 16   SpO2 95%   Visual Acuity Right Eye Distance:   Left  Eye Distance:   Bilateral Distance:    Right Eye Near:   Left Eye Near:    Bilateral Near:     Physical Exam Vitals and nursing note reviewed.  Constitutional:      General: She is not in acute distress.    Appearance: Normal appearance.  HENT:     Head: Normocephalic.     Right Ear: Tympanic membrane, ear canal and external ear normal.     Left Ear: Tympanic membrane, ear canal and external ear normal.     Nose: Congestion present.     Mouth/Throat:     Lips: Pink.     Mouth: Mucous membranes are moist.     Pharynx: Oropharynx is clear. Uvula midline. Posterior oropharyngeal erythema and postnasal drip present. No pharyngeal swelling, oropharyngeal exudate or uvula swelling.     Comments: Cobblestoning present to posterior oropharynx Eyes:     Extraocular Movements: Extraocular movements intact.     Conjunctiva/sclera: Conjunctivae normal.     Pupils: Pupils are equal, round, and reactive to light.  Cardiovascular:     Rate and Rhythm: Normal rate and regular rhythm.     Pulses: Normal pulses.     Heart sounds: Normal heart sounds.  Pulmonary:     Effort: Pulmonary effort is normal. No respiratory distress.     Breath sounds: Normal breath sounds. No stridor. No wheezing, rhonchi or rales.  Abdominal:     General: Bowel sounds are normal.     Palpations: Abdomen is soft.     Tenderness: There is no abdominal tenderness.  Musculoskeletal:     Cervical back: Normal range of motion.  Lymphadenopathy:     Cervical: No cervical adenopathy.  Skin:    General: Skin is warm and dry.  Neurological:     General: No focal deficit present.     Mental Status: She is alert and oriented to person, place, and time.  Psychiatric:        Mood and Affect: Mood normal.        Behavior: Behavior normal.      UC Treatments / Results  Labs (all labs ordered are listed, but only abnormal results are displayed) Labs Reviewed - No data to display  EKG   Radiology No results  found.  Procedures Procedures (including critical care time)  Medications Ordered in UC Medications - No data to display  Initial Impression / Assessment and Plan / UC Course  I have reviewed the triage vital signs and the nursing notes.  Pertinent labs & imaging results that were available during my care of the patient were reviewed by me and considered in my medical decision making (see chart for details).  The patient is well-appearing, she is in no acute distress, vital signs are stable.  Patient with positive self-administered home COVID test.  Will start patient on Paxlovid antiviral therapy.  For her cough, Promethazine DM was prescribed, and fluticasone 50 micro nasal spray prescribed for her nasal congestion.  Supportive care recommendations were provided and discussed with the patient to include increasing fluids, allowing for plenty of rest, over-the-counter analgesics for pain or discomfort, and use of a humidifier in her bedroom during sleep.  Discussed current COVID isolation guidelines.  Patient was advised that if she does develop a fever, she will need to remain home until she has been fever free for 24 hours with no medication.  Patient advised to continue wearing her mask as long as she is experiencing symptoms.  Patient was given ER follow-up precautions.  Patient is in agreement with this plan of care and verbalizes understanding.  All questions were answered.  Patient stable for discharge.   Final Clinical Impressions(s) / UC Diagnoses   Final diagnoses:  Positive self-administered antigen test for COVID-19     Discharge Instructions      Take medication as prescribed. Increase fluids and allow for plenty of rest.  As discussed, try to drink at least 8-10 8 ounce glasses of water daily while symptoms persist. Recommend over-the-counter ibuprofen or Tylenol as needed for pain, fever, general discomfort. Normal saline nasal spray throughout the day to help with  nasal congestion and runny nose. For your cough, recommend using a humidifier in your bedroom at nighttime during sleep and sleeping elevated on pillows while cough symptoms persist. Go to the emergency department immediately if you experience a high fever that does not improve with medication, shortness of breath, difficulty breathing, or other concerns. As discussed, continue to wear your mask while you are experiencing symptoms and until you complete the medication.  If you continue to experience symptoms after completing the medication, wear your mask for an additional 5 days.  You will need to remain home if you develop a fever.  Do not return to normal activities around others until you have been fever free for 24 hours with no medication. Follow-up as needed.      ED Prescriptions     Medication Sig Dispense Auth. Provider   nirmatrelvir & ritonavir (PAXLOVID, 300/100,) 20 x 150 MG & 10 x 100MG  TBPK Take 3 tablets by mouth 2 (two) times daily for 5 days. 30 tablet Nichole Neyer-Warren, Sadie Haber, NP   fluticasone (FLONASE) 50 MCG/ACT nasal spray Place 2 sprays into both nostrils daily. 16 g Dawood Spitler-Warren, Sadie Haber, NP   promethazine-dextromethorphan (PROMETHAZINE-DM) 6.25-15 MG/5ML syrup Take 5 mLs by mouth 4 (four) times daily as needed. 118 mL Tyan Lasure-Warren, Sadie Haber, NP      PDMP not reviewed this encounter.   Abran Cantor, NP 08/20/23 1229

## 2023-08-20 NOTE — Discharge Instructions (Signed)
Take medication as prescribed. Increase fluids and allow for plenty of rest.  As discussed, try to drink at least 8-10 8 ounce glasses of water daily while symptoms persist. Recommend over-the-counter ibuprofen or Tylenol as needed for pain, fever, general discomfort. Normal saline nasal spray throughout the day to help with nasal congestion and runny nose. For your cough, recommend using a humidifier in your bedroom at nighttime during sleep and sleeping elevated on pillows while cough symptoms persist. Go to the emergency department immediately if you experience a high fever that does not improve with medication, shortness of breath, difficulty breathing, or other concerns. As discussed, continue to wear your mask while you are experiencing symptoms and until you complete the medication.  If you continue to experience symptoms after completing the medication, wear your mask for an additional 5 days.  You will need to remain home if you develop a fever.  Do not return to normal activities around others until you have been fever free for 24 hours with no medication. Follow-up as needed.

## 2023-08-20 NOTE — ED Triage Notes (Signed)
Pt states cough and headache with chills for the past 3 days.  States she had a positive home covid test.  Has been taking dayquil and nyquil at home.
